# Patient Record
Sex: Male | Born: 1965 | Race: Black or African American | Hispanic: No | Smoking: Never smoker
Health system: Southern US, Community
[De-identification: ages and names within clinical notes are randomized; demographics above are authoritative.]

---

## 1997-10-31 ENCOUNTER — Encounter: Payer: Self-pay | Admitting: Emergency Medicine

## 1997-10-31 ENCOUNTER — Emergency Department (HOSPITAL_COMMUNITY): Admission: EM | Admit: 1997-10-31 | Discharge: 1997-10-31 | Payer: Self-pay | Admitting: Emergency Medicine

## 1998-10-26 ENCOUNTER — Emergency Department (HOSPITAL_COMMUNITY): Admission: EM | Admit: 1998-10-26 | Discharge: 1998-10-26 | Payer: Self-pay | Admitting: Emergency Medicine

## 2004-06-11 ENCOUNTER — Emergency Department (HOSPITAL_COMMUNITY): Admission: EM | Admit: 2004-06-11 | Discharge: 2004-06-11 | Payer: Self-pay | Admitting: Internal Medicine

## 2006-12-09 ENCOUNTER — Emergency Department (HOSPITAL_COMMUNITY): Admission: EM | Admit: 2006-12-09 | Discharge: 2006-12-09 | Payer: Self-pay | Admitting: Family Medicine
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
## 2018-11-18 ENCOUNTER — Ambulatory Visit (INDEPENDENT_AMBULATORY_CARE_PROVIDER_SITE_OTHER): Payer: 59

## 2018-11-18 ENCOUNTER — Other Ambulatory Visit: Payer: Self-pay

## 2018-11-18 ENCOUNTER — Ambulatory Visit (HOSPITAL_COMMUNITY)
Admission: EM | Admit: 2018-11-18 | Discharge: 2018-11-18 | Disposition: A | Discharge status: Home / Self Care | Payer: 59 | Attending: Emergency Medicine | Admitting: Emergency Medicine

## 2018-11-18 ENCOUNTER — Encounter (HOSPITAL_COMMUNITY): Payer: Self-pay

## 2018-11-18 DIAGNOSIS — U071 COVID-19: Secondary | ICD-10-CM

## 2018-11-18 DIAGNOSIS — J189 Pneumonia, unspecified organism: Secondary | ICD-10-CM | POA: Diagnosis not present

## 2018-11-18 MED ORDER — HYDROCOD POLST-CPM POLST ER 10-8 MG/5ML PO SUER: 5.0000 mL | Freq: Two times a day (BID) | ORAL | 0 refills | Status: DC | PRN

## 2018-11-18 MED ORDER — BENZONATATE 200 MG PO CAPS: 200.0000 mg | ORAL_CAPSULE | Freq: Three times a day (TID) | ORAL | 0 refills | Status: AC | PRN

## 2018-11-18 MED ORDER — HYDROCOD POLST-CPM POLST ER 10-8 MG/5ML PO SUER: 5.0000 mL | Freq: Two times a day (BID) | ORAL | 0 refills | Status: AC | PRN

## 2018-11-18 MED ORDER — AZITHROMYCIN 250 MG PO TABS: 250.0000 mg | ORAL_TABLET | Freq: Every day | ORAL | 0 refills | Status: AC

## 2018-11-18 MED ORDER — AEROCHAMBER PLUS MISC: 2 refills | Status: AC

## 2018-11-18 MED ORDER — AMOXICILLIN 500 MG PO TABS: 1000.0000 mg | ORAL_TABLET | Freq: Three times a day (TID) | ORAL | 0 refills | Status: AC

## 2018-11-18 MED ORDER — IBUPROFEN 600 MG PO TABS: 600.0000 mg | ORAL_TABLET | Freq: Four times a day (QID) | ORAL | 0 refills | Status: AC | PRN

## 2018-11-18 MED ORDER — IBUPROFEN 800 MG PO TABS
ORAL_TABLET | ORAL | Status: AC
  Filled 2018-11-18: qty 1

## 2018-11-18 MED ORDER — IBUPROFEN 800 MG PO TABS
800.0000 mg | ORAL_TABLET | Freq: Once | ORAL | Status: AC
  Administered 2018-11-18: 800 mg via ORAL

## 2018-11-18 MED ORDER — ALBUTEROL SULFATE HFA 108 (90 BASE) MCG/ACT IN AERS: 1.0000 | INHALATION_SPRAY | Freq: Four times a day (QID) | RESPIRATORY_TRACT | 0 refills | Status: AC | PRN

## 2018-11-18 MED ORDER — ALBUTEROL SULFATE (2.5 MG/3ML) 0.083% IN NEBU: 2.5000 mg | INHALATION_SOLUTION | Freq: Four times a day (QID) | RESPIRATORY_TRACT | 1 refills | Status: DC | PRN

## 2018-11-18 NOTE — ED Triage Notes (Signed)
Pt present SOB, pt has tested positive with covid 19 on 11/09/2018. The SOB started a week ago.

## 2018-11-18 NOTE — Discharge Instructions (Addendum)
You have a pneumonia, likely initially caused by COVID-19 but I suspect that you have a secondary bacterial infection.  I am sending home with amoxicillin and azithromycin for the infection.  2 puffs from your albuterol inhaler using your spacer every 4-6 hours as needed for shortness of breath.  Tessalon for the cough during the day, Tussionex for the cough at night.  600 mg ibuprofen combined with 1000 mg of Tylenol 3 or 4 times a day as needed for fevers.  You may not feel well for several more weeks, but she should not be getting worse.  If you get worse, go to the emergency department

## 2018-11-18 NOTE — ED Provider Notes (Signed)
HPI  SUBJECTIVE:  Vincent Little is a 53 y.o. male who presents with fever Tmax 102 for 9 days.  He was diagnosed with Covid 9 days ago.  Reports 5 days of shortness of breath, states that his dry cough has now become productive of thick yellow sputum.  This has started over the past 2 days.  He reports headaches, body aches, nasal congestion.  States that his sense of smell and taste are coming back.  He reports vomiting, but is able to tolerate liquids, diarrhea and anorexia.  No sore throat, wheezing, chest pain, abdominal pain.  He has been taking Tylenol every 6 hours, last dose was immediately prior to arrival.  He has also been taking DayQuil and NyQuil, zinc, elderberry.  The Tylenol, DayQuil NyQuil help.  No aggravating factors.  No calf pain, swelling, hemoptysis.  Dates that his daughter has Covid and got a secondary pneumonia and wonders if he has the same thing.  Past medical history negative for asthma, emphysema, COPD, smoking, diabetes, hypertension, chronic kidney disease, MI, HIV, cancer, immunocompromise, PE, DVT. PMD: Patient, No Pcp Per   History reviewed. No pertinent past medical history.  History reviewed. No pertinent surgical history.  History reviewed. No pertinent family history.  Social History   Tobacco Use  . Smoking status: Never Smoker  . Smokeless tobacco: Never Used  Substance Use Topics  . Alcohol use: Not on file  . Drug use: Not on file    No current facility-administered medications for this encounter.   Current Outpatient Medications:  .  albuterol (PROVENTIL) (2.5 MG/3ML) 0.083% nebulizer solution, Take 3 mLs (2.5 mg total) by nebulization every 6 (six) hours as needed for wheezing or shortness of breath., Disp: 20 mL, Rfl: 1 .  amoxicillin (AMOXIL) 500 MG tablet, Take 2 tablets (1,000 mg total) by mouth 3 (three) times daily for 5 days., Disp: 30 tablet, Rfl: 0 .  azithromycin (ZITHROMAX) 250 MG tablet, Take 1 tablet (250 mg total) by mouth daily.  2 tabs po on day 1, 1 tab po on days 2-5, Disp: 6 tablet, Rfl: 0 .  benzonatate (TESSALON) 200 MG capsule, Take 1 capsule (200 mg total) by mouth 3 (three) times daily as needed for cough., Disp: 30 capsule, Rfl: 0 .  chlorpheniramine-HYDROcodone (TUSSIONEX PENNKINETIC ER) 10-8 MG/5ML SUER, Take 5 mLs by mouth every 12 (twelve) hours as needed for cough., Disp: 60 mL, Rfl: 0 .  ibuprofen (ADVIL) 600 MG tablet, Take 1 tablet (600 mg total) by mouth every 6 (six) hours as needed., Disp: 30 tablet, Rfl: 0 .  Spacer/Aero-Holding Chambers (AEROCHAMBER PLUS) inhaler, Use as instructed, Disp: 1 each, Rfl: 2  No Known Allergies   ROS  As noted in HPI.   Physical Exam  BP (!) 142/87 (BP Location: Left Arm)   Pulse (!) 115   Temp (!) 100.8 F (38.2 C) (Oral)   Resp 16   SpO2 95%   Constitutional: Well developed, well nourished, no acute distress Eyes:  EOMI, conjunctiva normal bilaterally HENT: Normocephalic, atraumatic,mucus membranes moist Respiratory: Poor inspiratory effort, poor air movement, lungs clear bilaterally. Cardiovascular: Regular tachycardia no murmurs rubs or gallops GI: nondistended skin: No rash, skin intact Musculoskeletal : Calves symmetric, nontender, no edema Neurologic: Alert & oriented x 3, no focal neuro deficits Psychiatric: Speech and behavior appropriate   ED Course   Medications  ibuprofen (ADVIL) tablet 800 mg (800 mg Oral Given 11/18/18 1725)  ibuprofen (ADVIL) 800 MG tablet (has no administration in  time range)    Orders Placed This Encounter  Procedures  . DG Chest 2 View    Standing Status:   Standing    Number of Occurrences:   1    Order Specific Question:   Reason for Exam (SYMPTOM  OR DIAGNOSIS REQUIRED)    Answer:   COVID + r/o PNA    No results found for this or any previous visit (from the past 24 hour(s)). Dg Chest 2 View  Result Date: 11/18/2018 CLINICAL DATA:  COVID-19 infection diagnosed on 11/09/2018. Persistent shortness of  breath and cough. EXAM: CHEST - 2 VIEW COMPARISON:  None. FINDINGS: Borderline enlarged cardiac silhouette and mediastinal contours given slightly reduced lung volumes. Scattered bilateral nodular airspace opacities, left greater than right. No pleural effusion or pneumothorax. No evidence of edema. No acute osseous abnormalities. IMPRESSION: Scattered bilateral nodular airspace opacities, left greater than right, nonspecific though compatible with provided history of COVID-19 infection. Electronically Signed   By: Simonne ComeJohn  Watts M.D.   On: 11/18/2018 17:48    ED Clinical Impression  1. COVID-19 virus infection   2. Pneumonia of both lungs due to infectious organism, unspecified part of lung      ED Assessment/Plan  Patient diagnosed with Covid 9 days ago.  Suspect secondary bacterial pneumonia since the cough has now become productive of thick yellow purulent material..  Checking chest x-ray.    [place] [facility] database reviewed for this patient, and feel that the risk/benefit ratio today is favorable for proceeding with a prescription for controlled substance.  No opiate prescriptions since 2019.  Reviewed imaging independently.  Bilateral opacities consistent with COVID-19 see radiology report for full details.  will send home with Amox and azithromycin, an albuterol inhaler with a spacer, Mucinex D, Tessalon, Tussionex, saline nasal irrigation, continue Tylenol add IBU 600 mg to it 3 or 4 times a day as needed, discontinue over-the-counter cold medicine. Follow up With PMD as needed, to the ER if he gets worse.  Discussed imaging, MDM, treatment plan, and plan for follow-up with patient. Discussed sn/sx that should prompt return to the ED. patient agrees with plan.   Meds ordered this encounter  Medications  . ibuprofen (ADVIL) tablet 800 mg  . amoxicillin (AMOXIL) 500 MG tablet    Sig: Take 2 tablets (1,000 mg total) by mouth 3 (three) times daily for 5 days.    Dispense:  30 tablet     Refill:  0  . azithromycin (ZITHROMAX) 250 MG tablet    Sig: Take 1 tablet (250 mg total) by mouth daily. 2 tabs po on day 1, 1 tab po on days 2-5    Dispense:  6 tablet    Refill:  0  . albuterol (PROVENTIL) (2.5 MG/3ML) 0.083% nebulizer solution    Sig: Take 3 mLs (2.5 mg total) by nebulization every 6 (six) hours as needed for wheezing or shortness of breath.    Dispense:  20 mL    Refill:  1  . DISCONTD: chlorpheniramine-HYDROcodone (TUSSIONEX PENNKINETIC ER) 10-8 MG/5ML SUER    Sig: Take 5 mLs by mouth every 12 (twelve) hours as needed for cough.    Dispense:  60 mL    Refill:  0  . benzonatate (TESSALON) 200 MG capsule    Sig: Take 1 capsule (200 mg total) by mouth 3 (three) times daily as needed for cough.    Dispense:  30 capsule    Refill:  0  . Spacer/Aero-Holding Chambers (AEROCHAMBER PLUS) inhaler  Sig: Use as instructed    Dispense:  1 each    Refill:  2  . ibuprofen (ADVIL) 600 MG tablet    Sig: Take 1 tablet (600 mg total) by mouth every 6 (six) hours as needed.    Dispense:  30 tablet    Refill:  0  . DISCONTD: chlorpheniramine-HYDROcodone (TUSSIONEX PENNKINETIC ER) 10-8 MG/5ML SUER    Sig: Take 5 mLs by mouth every 12 (twelve) hours as needed for cough.    Dispense:  60 mL    Refill:  0  . chlorpheniramine-HYDROcodone (TUSSIONEX PENNKINETIC ER) 10-8 MG/5ML SUER    Sig: Take 5 mLs by mouth every 12 (twelve) hours as needed for cough.    Dispense:  60 mL    Refill:  0    *This clinic note was created using Scientist, clinical (histocompatibility and immunogenetics). Therefore, there may be occasional mistakes despite careful proofreading.   ?    Domenick Gong, MD 11/19/18 1138

## 2020-10-03 IMAGING — DX DG CHEST 2V
2 series · 2 of 2 positions shown · non-contrast
Comparison: None.

CLINICAL DATA: DU6NE-YS infection diagnosed on 11/09/2018.
Persistent shortness of breath and cough.

EXAM:
CHEST - 2 VIEW

[chest pa]
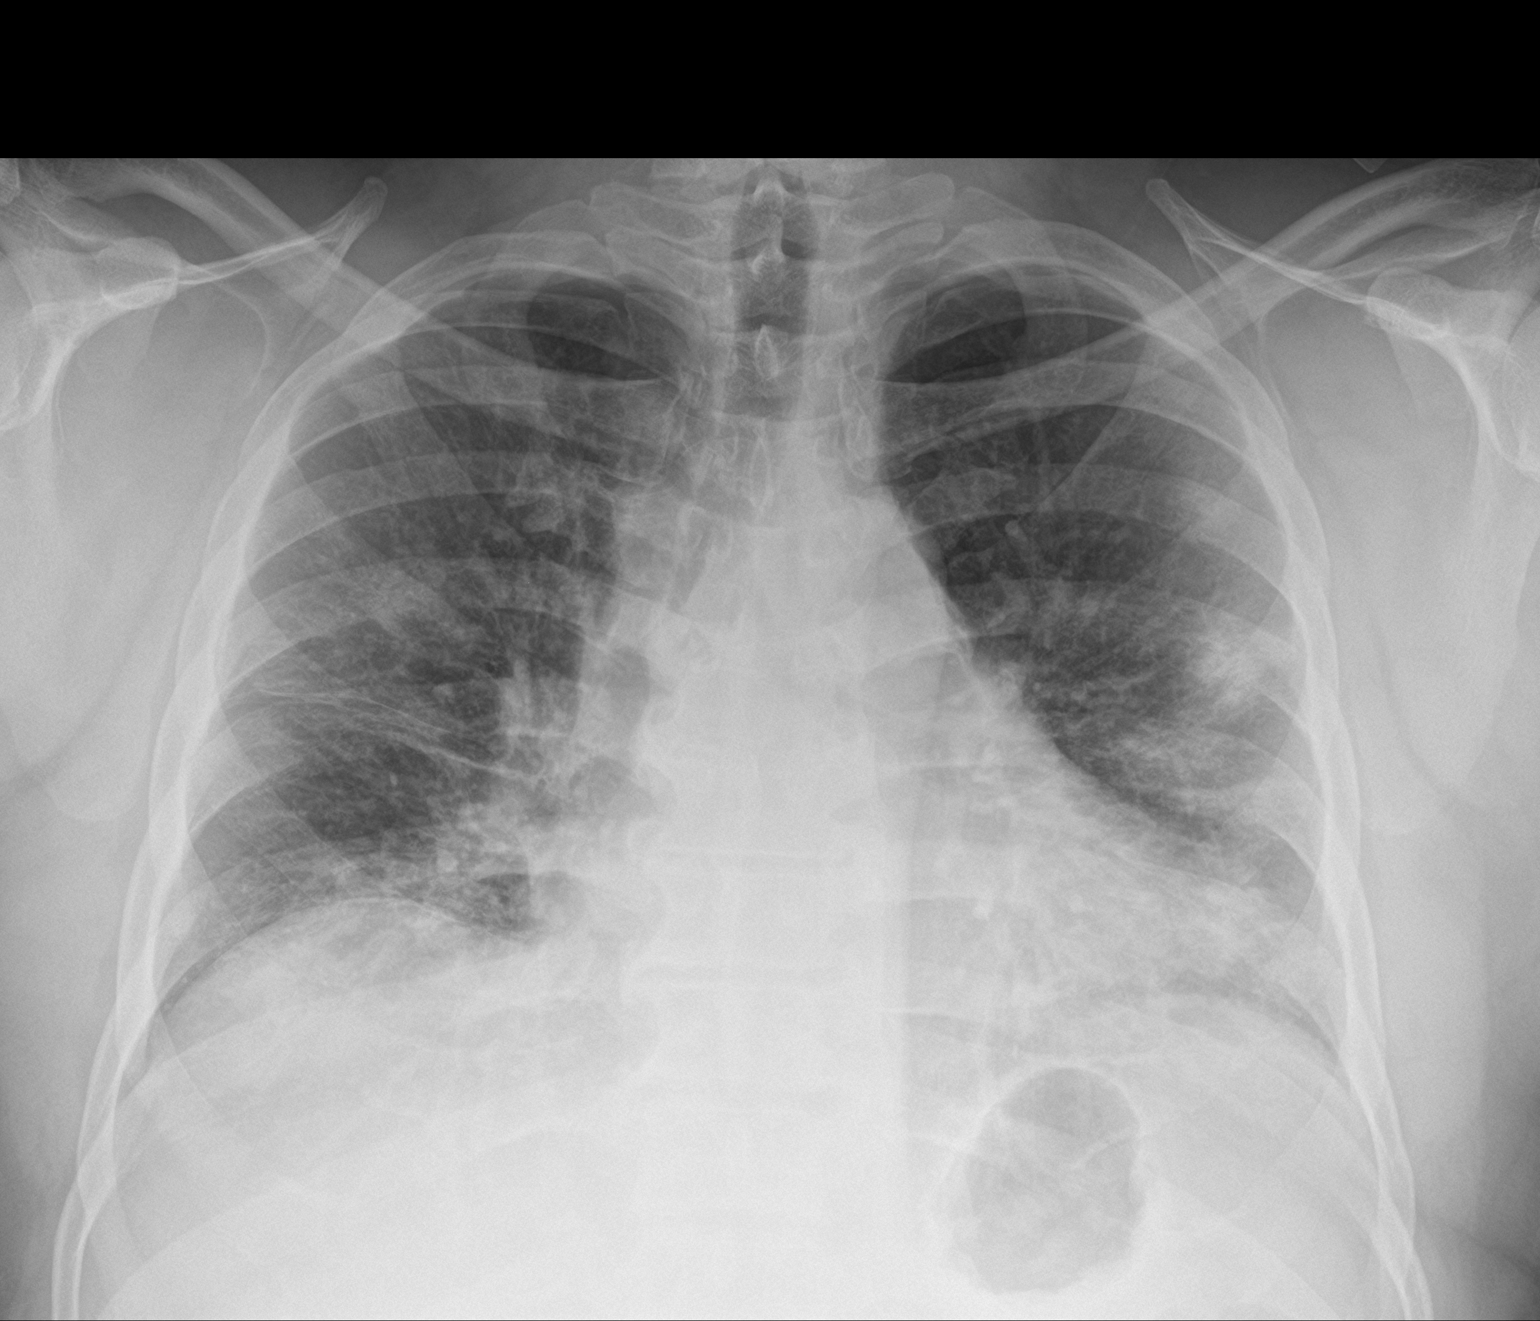

[chest lat]
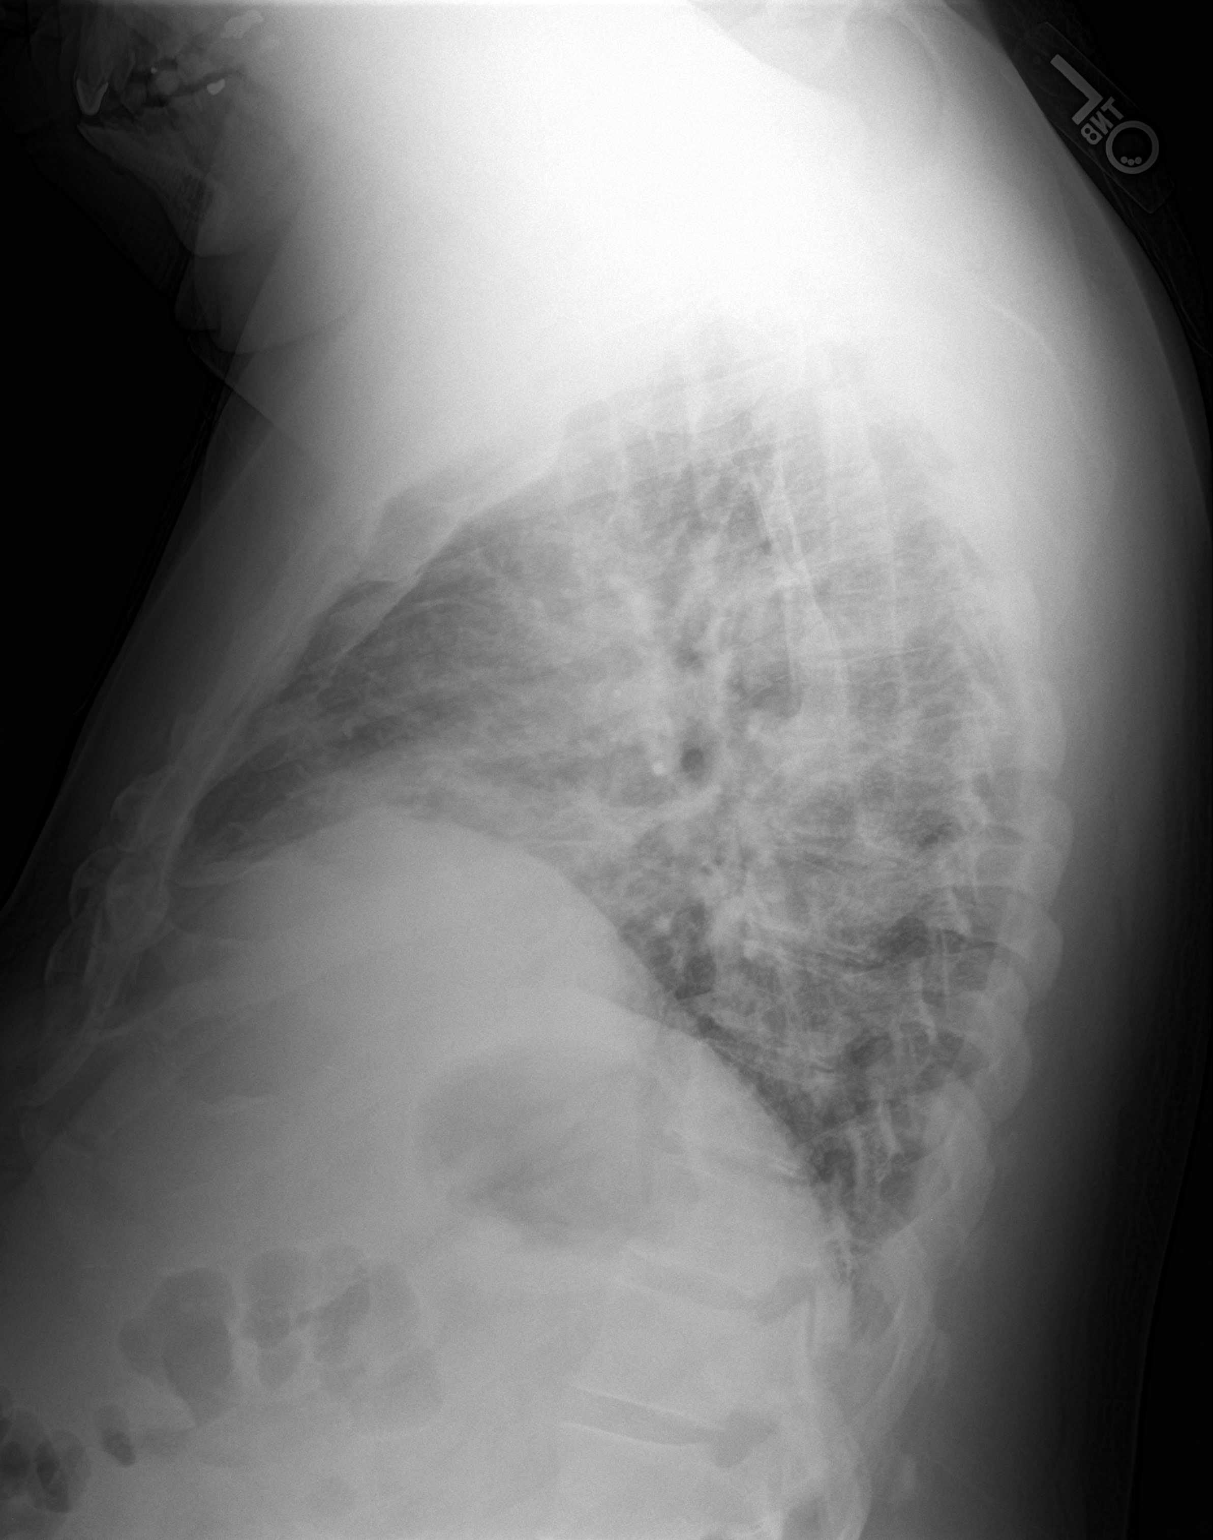

[2 of 2 positions shown; findings below may reference images not displayed]

FINDINGS: Borderline enlarged cardiac silhouette and mediastinal contours
given slightly reduced lung volumes. Scattered bilateral nodular
airspace opacities, left greater than right. No pleural effusion or
pneumothorax. No evidence of edema. No acute osseous abnormalities.
IMPRESSION: Scattered bilateral nodular airspace opacities, left greater than
right, nonspecific though compatible with provided history of
DU6NE-YS infection.
